# Patient Record
Sex: Female | Born: 2000
Health system: Southern US, Community
[De-identification: ages and names within clinical notes are randomized; demographics above are authoritative.]

## PROBLEM LIST (undated history)

## (undated) DIAGNOSIS — F419 Anxiety disorder, unspecified: Secondary | ICD-10-CM

## (undated) DIAGNOSIS — F329 Major depressive disorder, single episode, unspecified: Secondary | ICD-10-CM

## (undated) DIAGNOSIS — F32A Depression, unspecified: Secondary | ICD-10-CM

## (undated) DIAGNOSIS — Q793 Gastroschisis: Secondary | ICD-10-CM

## (undated) HISTORY — DX: Gastroschisis: Q79.3

## (undated) HISTORY — PX: OTHER SURGICAL HISTORY: SHX169

---
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
## 2022-03-18 IMAGING — US US OB < 14 WEEKS - US OB TV
1 series · 15 of 28 positions shown · non-contrast
Comparison: None.

CLINICAL DATA: Abdominal pain during pregnancy

EXAM:
OBSTETRIC <14 WK US AND TRANSVAGINAL OB US
TECHNIQUE: Both transabdominal and transvaginal ultrasound examinations were
performed for complete evaluation of the gestation as well as the
maternal uterus, adnexal regions, and pelvic cul-de-sac.
Transvaginal technique was performed to assess early pregnancy.

[Series 1: us ob < 14 weeks - us ob tv · 15 of 54 slices shown]
[im 1/54]
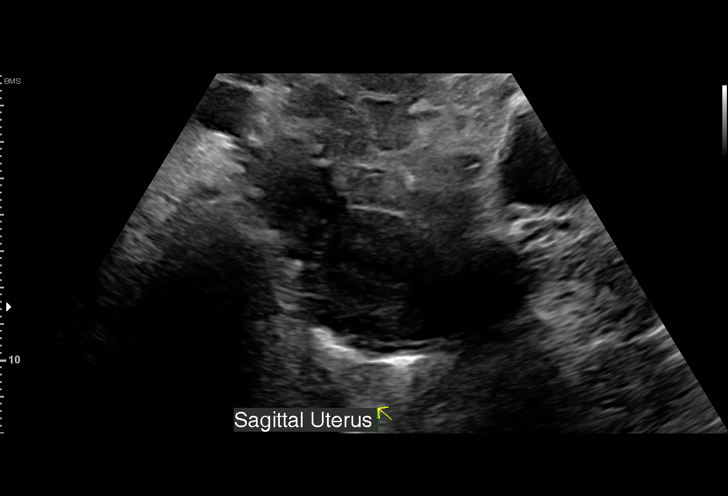
[im 4/54]
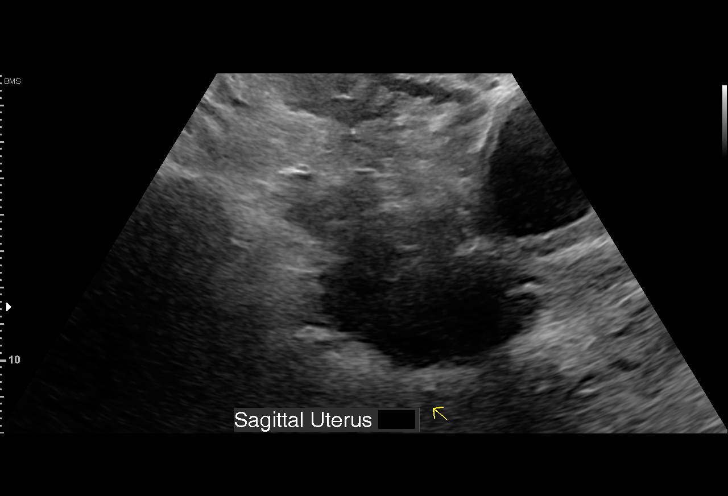
[im 8/54]
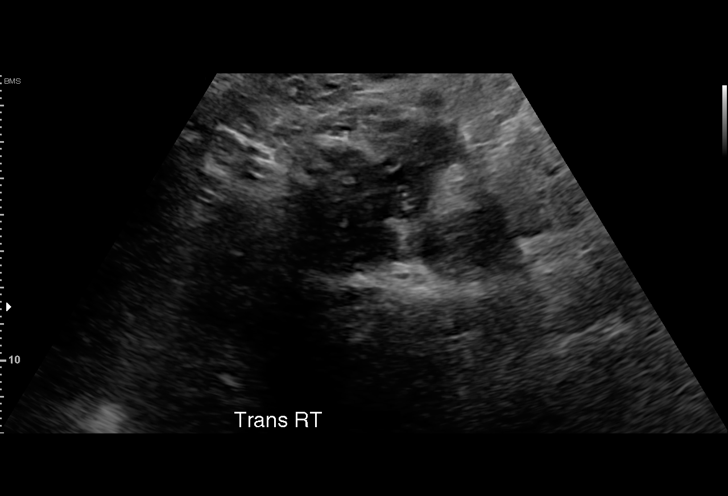
[im 12/54]
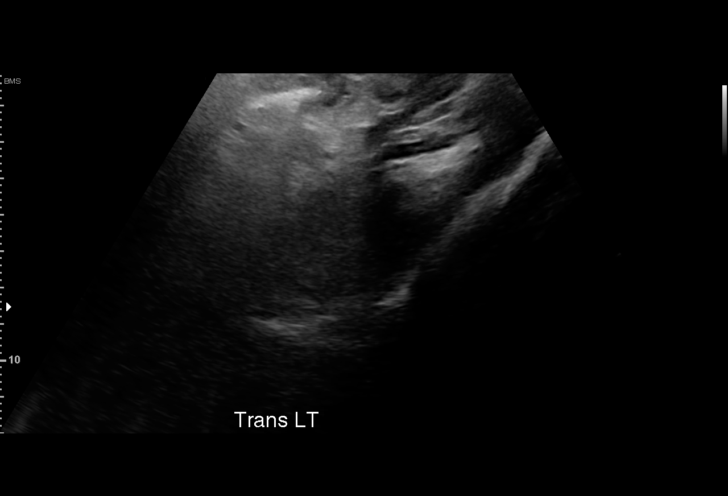
[im 16/54]
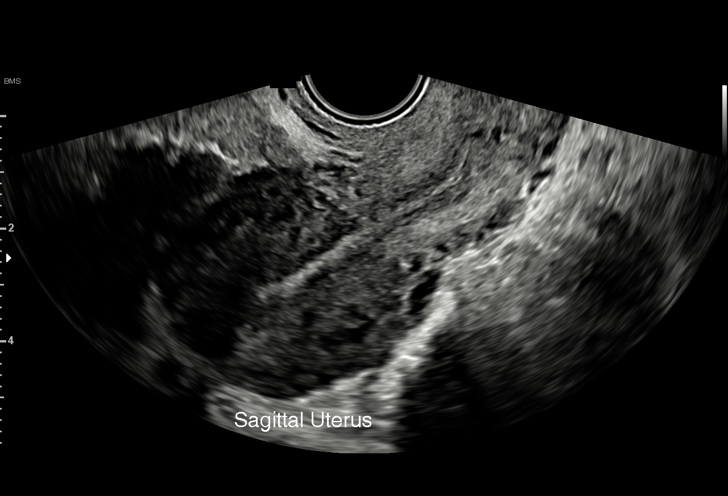
[im 20/54]
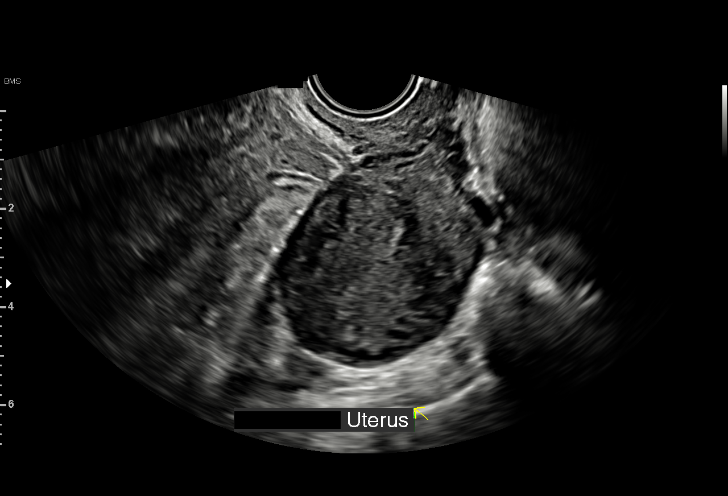
[im 24/54]
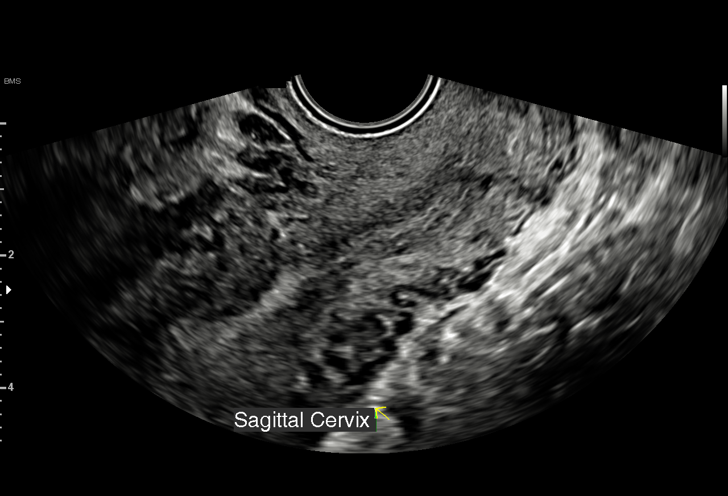
[im 28/54]
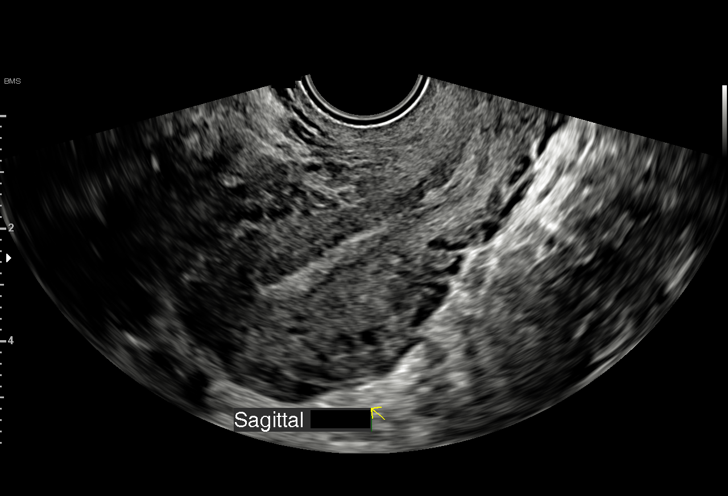
[im 30/54]
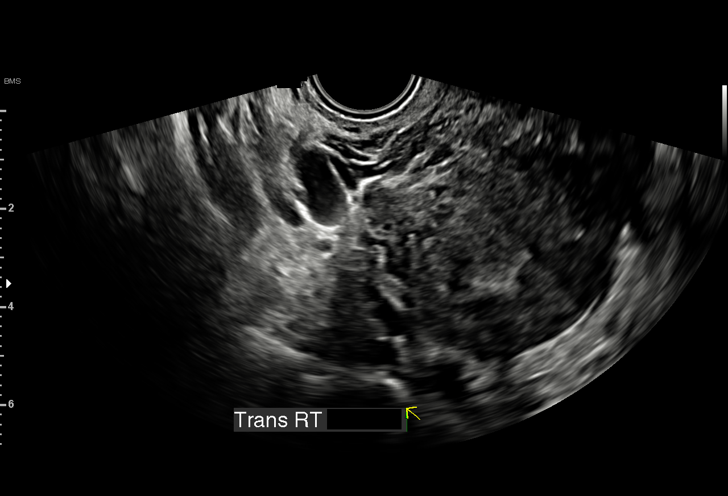
[im 34/54]
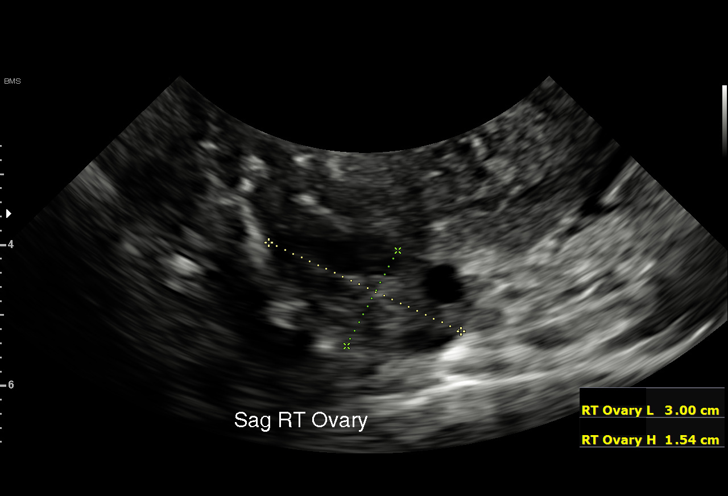
[im 38/54]
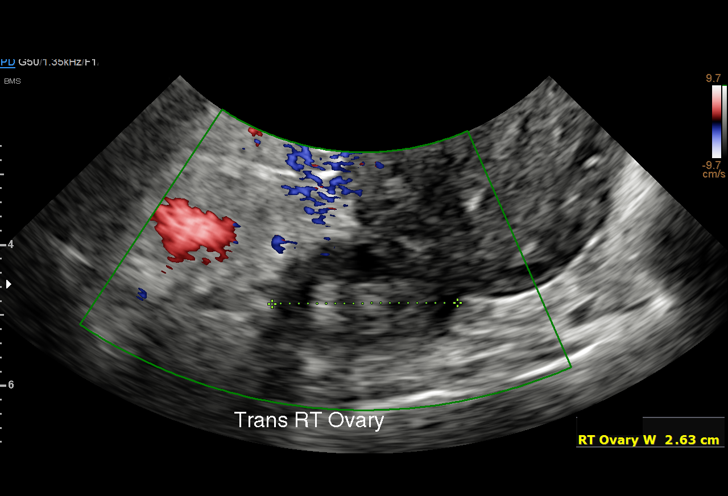
[im 42/54]
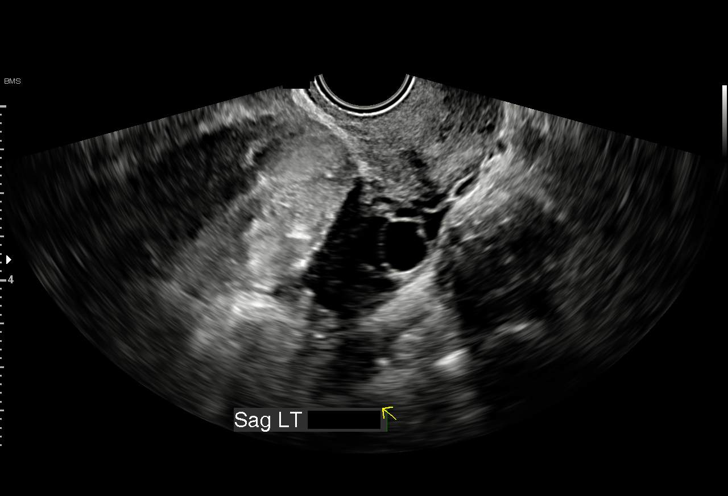
[im 46/54]
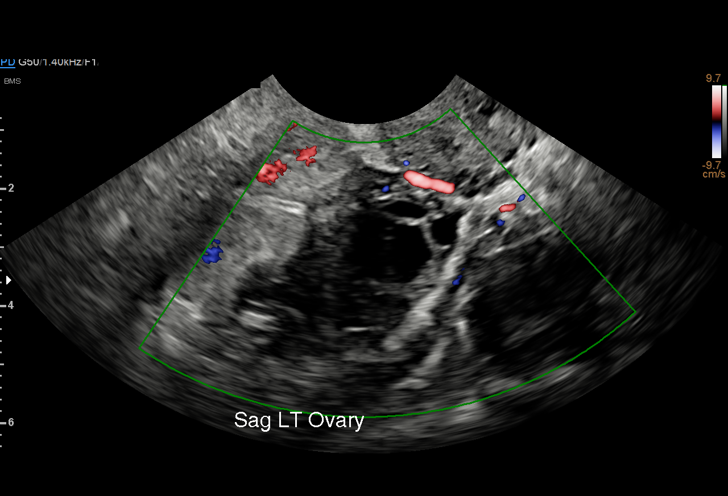
[im 50/54]
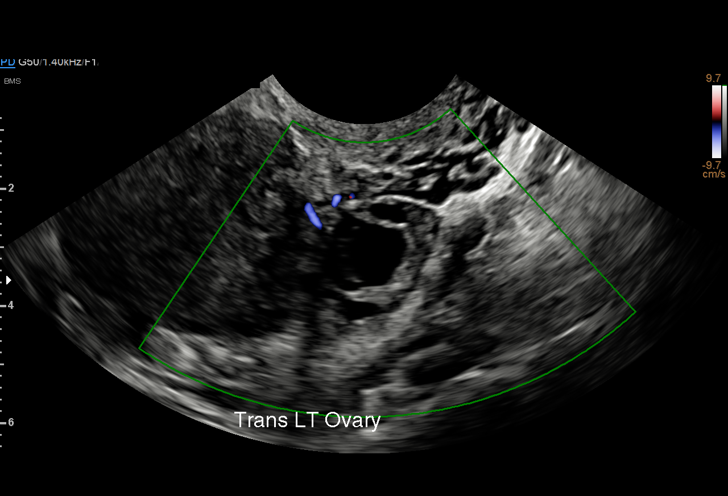
[im 54/54]
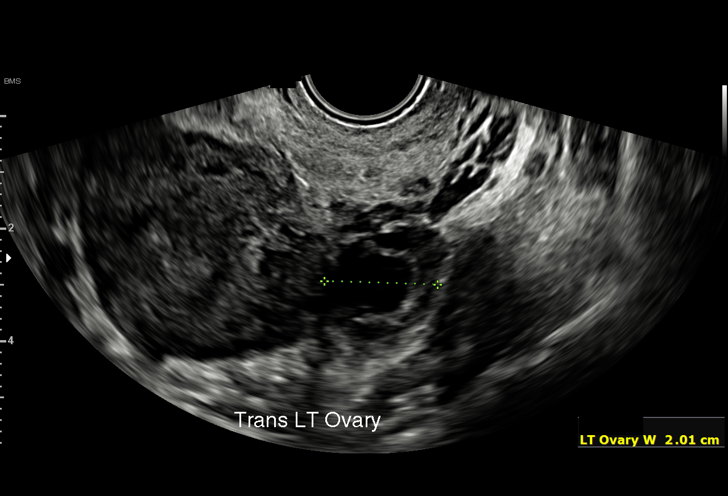

[15 of 28 positions shown; findings below may reference images not displayed]

FINDINGS: Intrauterine gestational sac: None

Yolk sac:  Not Visualized.

Embryo:  Not Visualized.

Cardiac Activity: Not Visualized.

Heart Rate: Not applicable

MSD: Not applicable mm

CRL:  Not applicable mm

Subchorionic hemorrhage:  None visualized.

Maternal uterus/adnexae: Normal right and left ovaries. Thick-walled
cyst in the left ovary disc possibly a corpus luteal cyst. There is
a Caesarean section scar noted anteriorly.
IMPRESSION: Pregnancy of unknown location. Recommend close clinical follow-up
with OBGYN and trending quantitative beta hCG.

## 2022-03-29 IMAGING — CR DG CHEST 2V
2 series · 2 of 2 positions shown · non-contrast
Comparison: September 23, 2020

CLINICAL DATA: COVID exposure with cough and congestion.

EXAM:
CHEST - 2 VIEW

[chest pa]
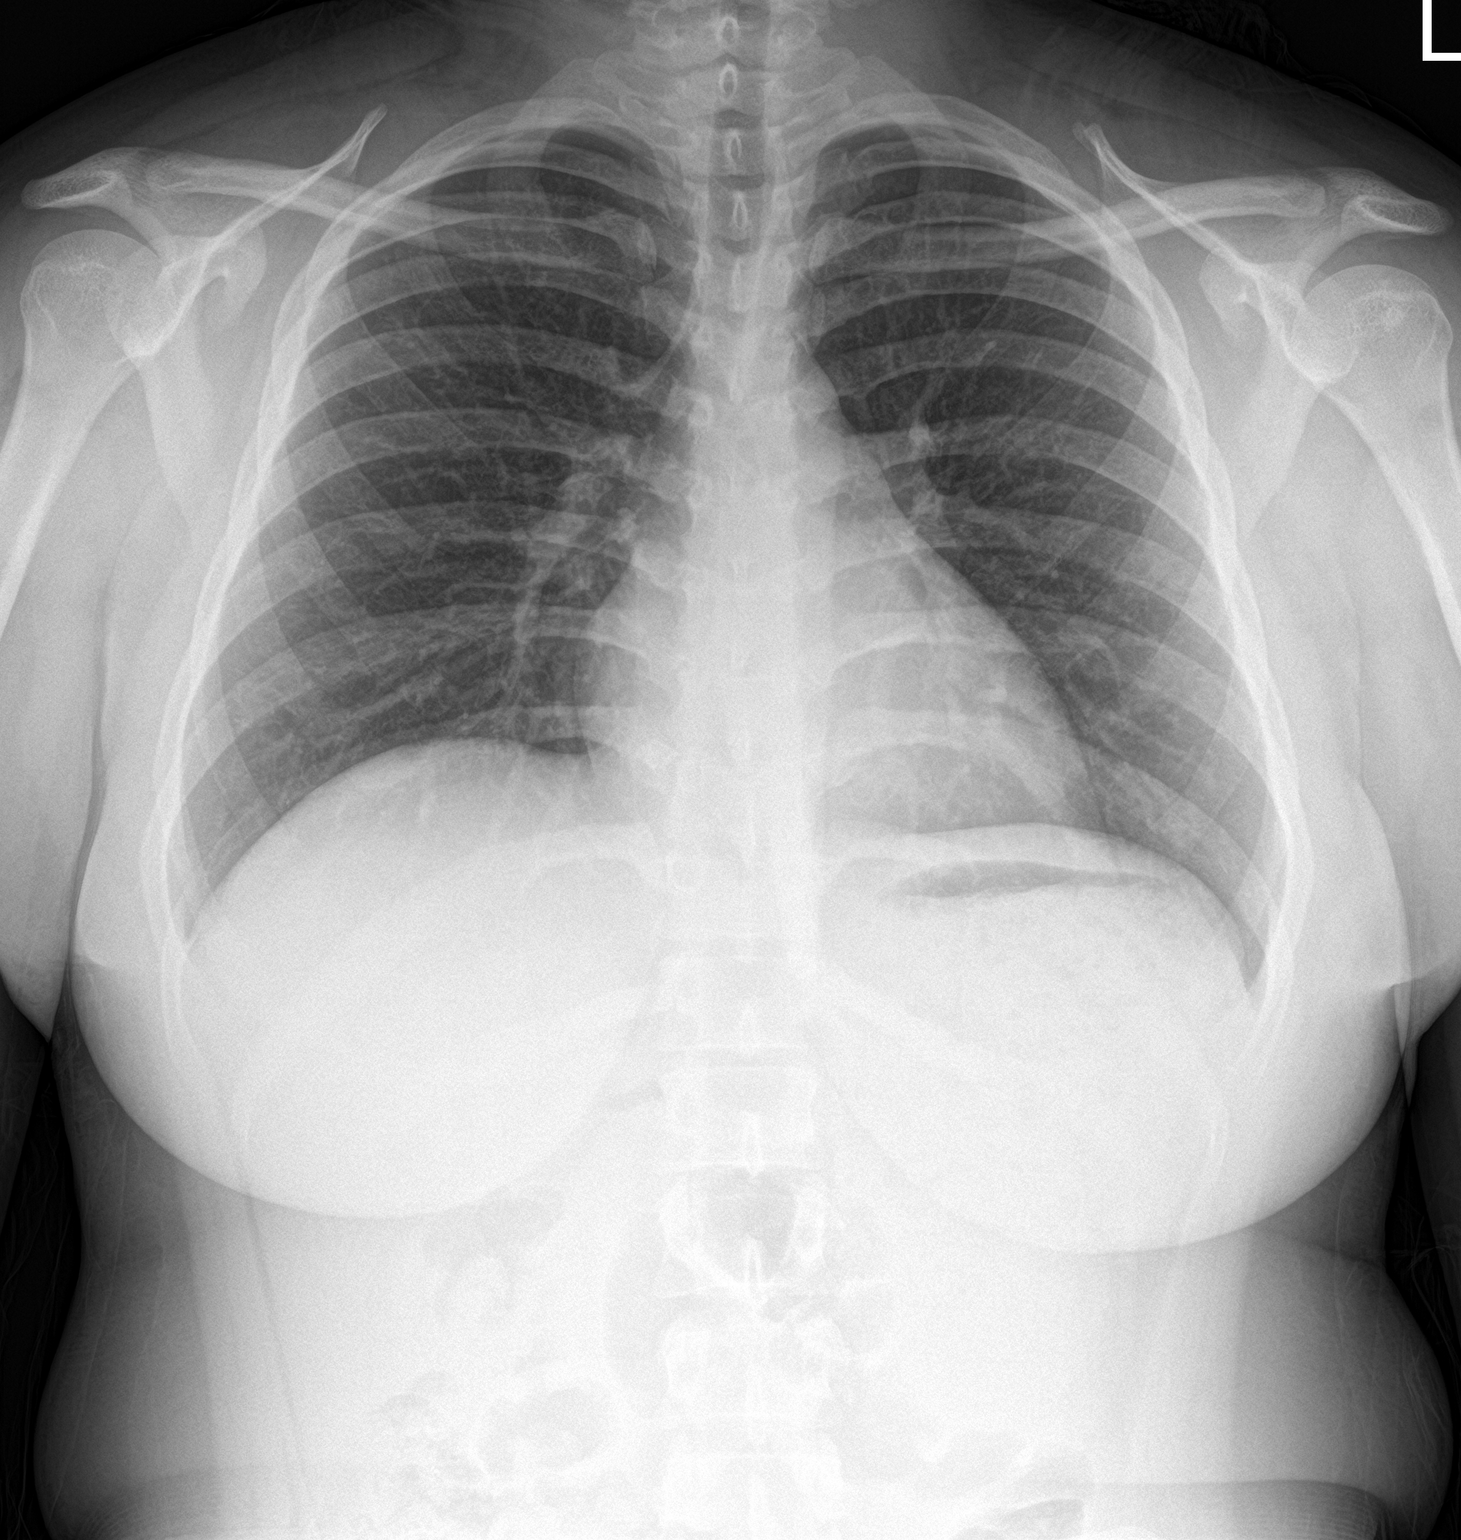

[chest lat]
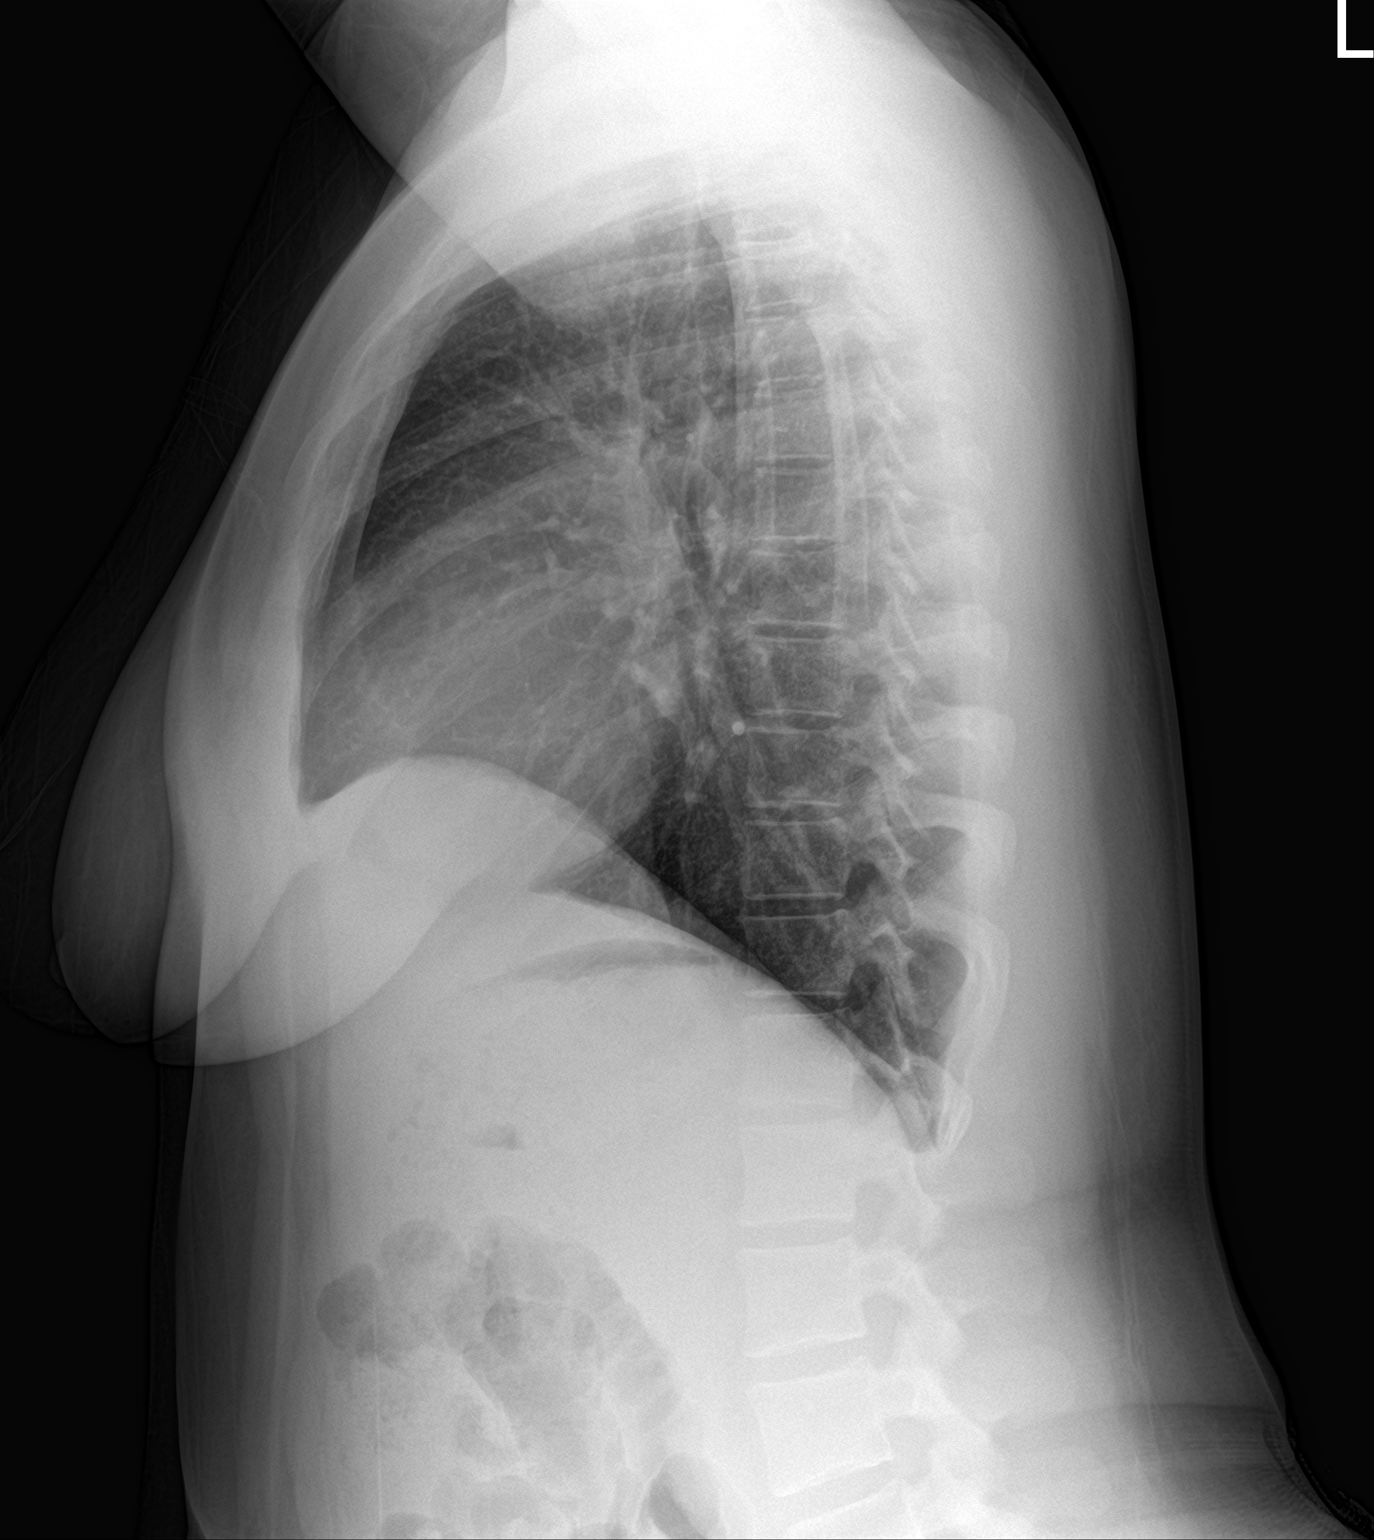

[2 of 2 positions shown; findings below may reference images not displayed]

FINDINGS: The heart size and mediastinal contours are within normal limits.
Both lungs are clear. The visualized skeletal structures are
unremarkable.
IMPRESSION: No active cardiopulmonary disease.
